# Patient Record
Sex: Male | Born: 1990
Health system: Southern US, Community
[De-identification: ages and names within clinical notes are randomized; demographics above are authoritative.]

## PROBLEM LIST (undated history)

## (undated) HISTORY — PX: HERNIA REPAIR: SHX51

---
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
## 2009-03-27 ENCOUNTER — Emergency Department (HOSPITAL_COMMUNITY): Admission: EM | Admit: 2009-03-27 | Discharge: 2009-03-27 | Payer: Self-pay | Admitting: Emergency Medicine

## 2009-10-18 ENCOUNTER — Emergency Department (HOSPITAL_COMMUNITY): Admission: EM | Admit: 2009-10-18 | Discharge: 2009-10-18 | Payer: Self-pay | Admitting: Emergency Medicine

## 2013-06-10 ENCOUNTER — Emergency Department (HOSPITAL_BASED_OUTPATIENT_CLINIC_OR_DEPARTMENT_OTHER): Payer: Worker's Compensation

## 2013-06-10 ENCOUNTER — Emergency Department (HOSPITAL_BASED_OUTPATIENT_CLINIC_OR_DEPARTMENT_OTHER)
Admission: EM | Admit: 2013-06-10 | Discharge: 2013-06-10 | Disposition: A | Discharge status: Home / Self Care | Payer: Worker's Compensation | Attending: Emergency Medicine | Admitting: Emergency Medicine

## 2013-06-10 ENCOUNTER — Encounter (HOSPITAL_BASED_OUTPATIENT_CLINIC_OR_DEPARTMENT_OTHER): Payer: Self-pay | Admitting: Emergency Medicine

## 2013-06-10 DIAGNOSIS — K439 Ventral hernia without obstruction or gangrene: Secondary | ICD-10-CM | POA: Insufficient documentation
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
## 2013-06-10 NOTE — ED Notes (Signed)
Pt states lifted something heavy at work on Friday and had a sharp pain, went to [facility] through work and dx with a hernia and referred to a Development worker, international aidgeneral surgeon. Pt states they are suppose to call him to make an appt. States pt was at work tonight and pain started again. Denies n/v

## 2013-06-10 NOTE — Discharge Instructions (Signed)
Hernia A hernia occurs when an internal organ pushes out through a weak spot in the abdominal wall. Hernias most commonly occur in the groin and around the navel. Hernias often can be pushed back into place (reduced). Most hernias tend to get worse over time. Some abdominal hernias can get stuck in the opening (irreducible or incarcerated hernia) and cannot be reduced. An irreducible abdominal hernia which is tightly squeezed into the opening is at risk for impaired blood supply (strangulated hernia). A strangulated hernia is a medical emergency. Because of the risk for an irreducible or strangulated hernia, surgery may be recommended to repair a hernia. CAUSES   Heavy lifting.  Prolonged coughing.  Straining to have a bowel movement.  A cut (incision) made during an abdominal surgery. HOME CARE INSTRUCTIONS   Bed rest is not required. You may continue your normal activities.  Avoid lifting more than 10 pounds (4.5 kg) or straining.  Cough gently. If you are a smoker it is best to stop. Even the best hernia repair can break down with the continual strain of coughing. Even if you do not have your hernia repaired, a cough will continue to aggravate the problem.  Do not wear anything tight over your hernia. Do not try to keep it in with an outside bandage or truss. These can damage abdominal contents if they are trapped within the hernia sac.  Eat a normal diet.  Avoid constipation. Straining over long periods of time will increase hernia size and encourage breakdown of repairs. If you cannot do this with diet alone, stool softeners may be used. SEEK IMMEDIATE MEDICAL CARE IF:   You have a fever.  You develop increasing abdominal pain.  You feel nauseous or vomit.  Your hernia is stuck outside the abdomen, looks discolored, feels hard, or is tender.  You have any changes in your bowel habits or in the hernia that are unusual for you.  You have increased pain or swelling around the  hernia.  You cannot push the hernia back in place by applying gentle pressure while lying down. MAKE SURE YOU:   Understand these instructions.  Will watch your condition.  Will get help right away if you are not doing well or get worse. Document Released: 05/08/2005 Document Revised: 07/31/2011 Document Reviewed: 12/26/2007 [employer] Patient Information 2014 [employer].  

## 2013-08-27 ENCOUNTER — Ambulatory Visit (HOSPITAL_COMMUNITY): Payer: Managed Care, Other (non HMO) | Attending: Family Medicine | Admitting: Physical Therapy

## 2014-03-06 ENCOUNTER — Encounter (HOSPITAL_COMMUNITY): Payer: Self-pay | Admitting: Emergency Medicine

## 2014-03-06 ENCOUNTER — Emergency Department (HOSPITAL_COMMUNITY)
Admission: EM | Admit: 2014-03-06 | Discharge: 2014-03-06 | Disposition: A | Discharge status: Home / Self Care | Payer: Managed Care, Other (non HMO) | Attending: Emergency Medicine | Admitting: Emergency Medicine

## 2014-03-06 ENCOUNTER — Emergency Department (HOSPITAL_COMMUNITY): Payer: Managed Care, Other (non HMO)

## 2014-03-06 DIAGNOSIS — R109 Unspecified abdominal pain: Secondary | ICD-10-CM | POA: Insufficient documentation

## 2014-03-06 DIAGNOSIS — K529 Noninfective gastroenteritis and colitis, unspecified: Secondary | ICD-10-CM | POA: Insufficient documentation

## 2014-03-06 DIAGNOSIS — M791 Myalgia: Secondary | ICD-10-CM | POA: Diagnosis not present

## 2014-03-06 DIAGNOSIS — Z79899 Other long term (current) drug therapy: Secondary | ICD-10-CM | POA: Insufficient documentation

## 2014-03-06 DIAGNOSIS — R5383 Other fatigue: Secondary | ICD-10-CM | POA: Insufficient documentation

## 2014-03-06 DIAGNOSIS — R112 Nausea with vomiting, unspecified: Secondary | ICD-10-CM | POA: Diagnosis present

## 2014-03-06 DIAGNOSIS — R0981 Nasal congestion: Secondary | ICD-10-CM | POA: Insufficient documentation

## 2014-03-06 LAB — BASIC METABOLIC PANEL
Anion gap: 16 — ABNORMAL HIGH (ref 5–15)
BUN: 18 mg/dL (ref 6–23)
CO2: 24 mEq/L (ref 19–32)
Calcium: 10.3 mg/dL (ref 8.4–10.5)
Chloride: 100 mEq/L (ref 96–112)
Creatinine, Ser: 1.31 mg/dL (ref 0.50–1.35)
GFR calc Af Amer: 86 mL/min — ABNORMAL LOW (ref >90)
GFR calc non Af Amer: 75 mL/min — ABNORMAL LOW (ref >90)
Glucose, Bld: 125 mg/dL — ABNORMAL HIGH (ref 70–99)
Potassium: 4.6 mEq/L (ref 3.7–5.3)
Sodium: 140 mEq/L (ref 137–147)

## 2014-03-06 LAB — CBC WITH DIFFERENTIAL/PLATELET
Basophils Absolute: 0 10*3/uL (ref 0.0–0.1)
Basophils Relative: 0 % (ref 0–1)
Eosinophils Absolute: 0 10*3/uL (ref 0.0–0.7)
Eosinophils Relative: 0 % (ref 0–5)
HCT: 49.6 % (ref 39.0–52.0)
Hemoglobin: 16.6 g/dL (ref 13.0–17.0)
Lymphocytes Relative: 5 % — ABNORMAL LOW (ref 12–46)
Lymphs Abs: 0.7 10*3/uL (ref 0.7–4.0)
MCH: 26.3 pg (ref 26.0–34.0)
MCHC: 33.5 g/dL (ref 30.0–36.0)
MCV: 78.6 fL (ref 78.0–100.0)
Monocytes Absolute: 0.7 10*3/uL (ref 0.1–1.0)
Monocytes Relative: 6 % (ref 3–12)
NEUTROS PCT: 89 % — AB (ref 43–77)
Neutro Abs: 11 10*3/uL — ABNORMAL HIGH (ref 1.7–7.7)
Platelets: 195 10*3/uL (ref 150–400)
RBC: 6.31 MIL/uL — ABNORMAL HIGH (ref 4.22–5.81)
RDW: 13.4 % (ref 11.5–15.5)
WBC: 12.4 10*3/uL — AB (ref 4.0–10.5)

## 2014-03-06 MED ORDER — ONDANSETRON HCL 4 MG/2ML IJ SOLN
4.0000 mg | Freq: Once | INTRAMUSCULAR | Status: AC
  Administered 2014-03-06: 4 mg via INTRAVENOUS
  Filled 2014-03-06: qty 2

## 2014-03-06 MED ORDER — ONDANSETRON 4 MG PO TBDP
4.0000 mg | ORAL_TABLET | Freq: Three times a day (TID) | ORAL | Status: DC | PRN
Start: 2014-03-06 — End: 2021-12-13

## 2014-03-06 MED ORDER — PROMETHAZINE HCL 25 MG PO TABS: 25.0000 mg | ORAL_TABLET | Freq: Four times a day (QID) | ORAL | Status: DC | PRN

## 2014-03-06 MED ORDER — LOPERAMIDE HCL 2 MG PO TABS: 2.0000 mg | ORAL_TABLET | Freq: Four times a day (QID) | ORAL | Status: DC | PRN

## 2014-03-06 MED ORDER — SODIUM CHLORIDE 0.9 % IV SOLN
INTRAVENOUS | Status: DC
  Administered 2014-03-06: 07:00:00 via INTRAVENOUS

## 2014-03-06 MED ORDER — HYDROMORPHONE HCL 1 MG/ML IJ SOLN
0.5000 mg | Freq: Once | INTRAMUSCULAR | Status: AC
  Administered 2014-03-06: 0.5 mg via INTRAVENOUS
  Filled 2014-03-06: qty 1

## 2014-03-06 NOTE — ED Notes (Signed)
Patient c/o vomiting and diarrhea since last evening.

## 2014-03-06 NOTE — Discharge Instructions (Signed)
Symptoms most likely consistent with a viral stomach bug. Labs without significant amounts. X-rays show show no evidence of any bowel obstruction. Take Phenergan and/or Zofran as needed for nausea and vomiting. The Zofran can be expensive the Phenergan is not. Imodium right ear as for the diarrhea. Small amounts of fluids frequently when keeping fluids down then advance to bland diet. Work note provided to be off for the next 2 days. Return for any new or worse symptoms.

## 2014-03-06 NOTE — ED Provider Notes (Signed)
CSN: [id number]     Arrival date & time 03/06/14  0444 History   First MD Initiated Contact with Patient 03/06/14 [phone]     Chief Complaint  Patient presents with  . Emesis     (Consider location/radiation/quality/duration/timing/severity/associated sxs/prior Treatment) Patient is a 23 y.o. male presenting with vomiting. The history is provided by the patient.  Emesis Associated symptoms: abdominal pain, diarrhea and myalgias   Associated symptoms: no headaches    patient feeling well yesterday she was onset at 11:00 PM of vomiting and diarrhea 5 episodes today. No blood in either one. Associated with crampy abdominal pain is generalized. Patient states pain is about 3/10. Associated with bodyaches no fever. No sick contacts. Patient was at work when he gets sick. Patient's past medical history is noncontributory. Patient does have a history of hernia repair.  History reviewed. No pertinent past medical history. Past Surgical History  Procedure Laterality Date  . Hernia repair     No family history on file. History  Substance Use Topics  . Smoking status: Never Smoker   . Smokeless tobacco: Not on file  . Alcohol Use: No    Review of Systems  Constitutional: Positive for fatigue. Negative for fever.  HENT: Positive for congestion.   Eyes: Negative for visual disturbance.  Respiratory: Negative for shortness of breath.   Cardiovascular: Negative for chest pain.  Gastrointestinal: Positive for nausea, vomiting, abdominal pain and diarrhea.  Genitourinary: Negative for hematuria.  Musculoskeletal: Positive for myalgias.  Skin: Negative for rash.  Neurological: Negative for headaches.  Hematological: Does not bruise/bleed easily.  Psychiatric/Behavioral: Negative for confusion.      Allergies  Review of patient's allergies indicates no known allergies.  Home Medications   Prior to Admission medications   Medication Sig Start Date End Date Taking? Authorizing Provider   loperamide (IMODIUM A-D) 2 MG tablet Take 1 tablet (2 mg total) by mouth 4 (four) times daily as needed for diarrhea or loose stools. 03/06/14   Vanetta MuldersScott Sophie Tamez, MD  ondansetron (ZOFRAN ODT) 4 MG disintegrating tablet Take 1 tablet (4 mg total) by mouth every 8 (eight) hours as needed. 03/06/14   Vanetta MuldersScott Jaymz Traywick, MD  promethazine (PHENERGAN) 25 MG tablet Take 1 tablet (25 mg total) by mouth every 6 (six) hours as needed. 03/06/14   Vanetta MuldersScott Allison Deshotels, MD   BP 132/86  Pulse 80  Temp(Src) 98.2 F (36.8 C) (Oral)  Resp 16  Ht 6' (1.829 m)  Wt 280 lb (127.007 kg)  BMI 37.97 kg/m2  SpO2 96% Physical Exam  Nursing note and vitals reviewed. Constitutional: He is oriented to person, place, and time. He appears well-developed and well-nourished. No distress.  HENT:  Head: Normocephalic and atraumatic.  Mouth/Throat: Oropharynx is clear and moist.  Eyes: Conjunctivae and EOM are normal. Pupils are equal, round, and reactive to light.  Neck: Normal range of motion.  Cardiovascular: Normal rate, regular rhythm and normal heart sounds.   No murmur heard. Pulmonary/Chest: Effort normal and breath sounds normal. No respiratory distress.  Abdominal: Soft. Bowel sounds are normal. There is no tenderness.  Musculoskeletal: Normal range of motion.  Neurological: He is alert and oriented to person, place, and time. No cranial nerve deficit. He exhibits normal muscle tone. Coordination normal.  Skin: Skin is warm. No rash noted.    ED Course  Procedures (including critical care time) Labs Review Labs Reviewed  CBC WITH DIFFERENTIAL - Abnormal; Notable for the following:    WBC 12.4 (*)  RBC 6.31 (*)    Neutrophils Relative % 89 (*)    Neutro Abs 11.0 (*)    Lymphocytes Relative 5 (*)    All other components within normal limits  BASIC METABOLIC PANEL - Abnormal; Notable for the following:    Glucose, Bld 125 (*)    GFR calc non Af Amer 75 (*)    GFR calc Af Amer 86 (*)    Anion gap 16 (*)     All other components within normal limits   Results for orders placed during the hospital encounter of 03/06/14  CBC WITH DIFFERENTIAL      Result Value Ref Range   WBC 12.4 (*) 4.0 - 10.5 K/uL   RBC 6.31 (*) 4.22 - 5.81 MIL/uL   Hemoglobin 16.6  13.0 - 17.0 g/dL   HCT 16.1  09.6 - 04.5 %   MCV 78.6  78.0 - 100.0 fL   MCH 26.3  26.0 - 34.0 pg   MCHC 33.5  30.0 - 36.0 g/dL   RDW 40.9  81.1 - 91.4 %   Platelets 195  150 - 400 K/uL   Neutrophils Relative % 89 (*) 43 - 77 %   Neutro Abs 11.0 (*) 1.7 - 7.7 K/uL   Lymphocytes Relative 5 (*) 12 - 46 %   Lymphs Abs 0.7  0.7 - 4.0 K/uL   Monocytes Relative 6  3 - 12 %   Monocytes Absolute 0.7  0.1 - 1.0 K/uL   Eosinophils Relative 0  0 - 5 %   Eosinophils Absolute 0.0  0.0 - 0.7 K/uL   Basophils Relative 0  0 - 1 %   Basophils Absolute 0.0  0.0 - 0.1 K/uL  BASIC METABOLIC PANEL      Result Value Ref Range   Sodium 140  137 - 147 mEq/L   Potassium 4.6  3.7 - 5.3 mEq/L   Chloride 100  96 - 112 mEq/L   CO2 24  19 - 32 mEq/L   Glucose, Bld 125 (*) 70 - 99 mg/dL   BUN 18  6 - 23 mg/dL   Creatinine, Ser 7.82  0.50 - 1.35 mg/dL   Calcium 95.6  8.4 - 21.3 mg/dL   GFR calc non Af Amer 75 (*) >90 mL/min   GFR calc Af Amer 86 (*) >90 mL/min   Anion gap 16 (*) 5 - 15     Imaging Review Dg Abd Acute W/chest  03/06/2014   CLINICAL DATA:  Emesis since 03/05/2014.  EXAM: ACUTE ABDOMEN SERIES (ABDOMEN 2 VIEW & CHEST 1 VIEW)  COMPARISON:  None.  FINDINGS: Normal cardiac silhouette. Lungs are clear. No air beneath hemidiaphragm.  No dilated loops large or small bowel. There is a paucity gas within the abdomen No again megaly. No pathologic calcifications. Benign bony excrescence extending from the left acetabular region.  IMPRESSION: 1.  No acute cardiopulmonary process. 2. Paucity bowel gas is consistent history of emesis. No evidence of bowel obstruction.   Electronically Signed   By: Genevive Bi M.D.   On: 03/06/2014 07:32     EKG  Interpretation None      MDM   Final diagnoses:  Gastroenteritis    Labs clinical finding history and x-rays consistent with a viral gastroenteritis. No acute surgical process. Patient feels better with IV fluids antinausea medicine. A little bit of pain medicine. Patient we discharged home with Imodium and Zofran and Phenergan. Patient's wife will be with him. Work note provided the  office next few days.  Acute abdominal series without evidence of bowel obstruction.   Vanetta MuldersScott Kashana Breach, MD 03/06/14 [phone]

## 2015-09-25 LAB — BASIC METABOLIC PANEL: Glucose: 89 mg/dL

## 2016-08-25 ENCOUNTER — Emergency Department (HOSPITAL_COMMUNITY)
Admission: EM | Admit: 2016-08-25 | Discharge: 2016-08-25 | Disposition: A | Discharge status: Home / Self Care | Payer: No Typology Code available for payment source | Attending: Emergency Medicine | Admitting: Emergency Medicine

## 2016-08-25 ENCOUNTER — Emergency Department (HOSPITAL_COMMUNITY): Payer: No Typology Code available for payment source

## 2016-08-25 ENCOUNTER — Encounter (HOSPITAL_COMMUNITY): Payer: Self-pay | Admitting: *Deleted

## 2016-08-25 DIAGNOSIS — Y92410 Unspecified street and highway as the place of occurrence of the external cause: Secondary | ICD-10-CM | POA: Insufficient documentation

## 2016-08-25 DIAGNOSIS — S39012A Strain of muscle, fascia and tendon of lower back, initial encounter: Secondary | ICD-10-CM | POA: Diagnosis not present

## 2016-08-25 DIAGNOSIS — S3992XA Unspecified injury of lower back, initial encounter: Secondary | ICD-10-CM | POA: Diagnosis present

## 2016-08-25 DIAGNOSIS — Y939 Activity, unspecified: Secondary | ICD-10-CM | POA: Insufficient documentation

## 2016-08-25 DIAGNOSIS — T148XXA Other injury of unspecified body region, initial encounter: Secondary | ICD-10-CM

## 2016-08-25 DIAGNOSIS — Y999 Unspecified external cause status: Secondary | ICD-10-CM | POA: Diagnosis not present

## 2016-08-25 DIAGNOSIS — M545 Low back pain, unspecified: Secondary | ICD-10-CM

## 2016-08-25 MED ORDER — CYCLOBENZAPRINE HCL 5 MG PO TABS: 5.0000 mg | ORAL_TABLET | Freq: Three times a day (TID) | ORAL | 0 refills | Status: DC | PRN

## 2016-08-25 MED ORDER — ACETAMINOPHEN 325 MG PO TABS
650.0000 mg | ORAL_TABLET | Freq: Once | ORAL | Status: AC
  Administered 2016-08-25: 650 mg via ORAL
  Filled 2016-08-25: qty 2

## 2016-08-25 MED ORDER — IBUPROFEN 800 MG PO TABS: 800.0000 mg | ORAL_TABLET | Freq: Three times a day (TID) | ORAL | 0 refills | Status: DC
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
## 2016-08-25 NOTE — ED Notes (Signed)
Pt in radiology 

## 2016-09-01 ENCOUNTER — Emergency Department (HOSPITAL_COMMUNITY): Payer: No Typology Code available for payment source

## 2016-09-01 ENCOUNTER — Emergency Department (HOSPITAL_COMMUNITY)
Admission: EM | Admit: 2016-09-01 | Discharge: 2016-09-01 | Disposition: A | Discharge status: Home / Self Care | Payer: No Typology Code available for payment source | Attending: Emergency Medicine | Admitting: Emergency Medicine

## 2016-09-01 ENCOUNTER — Encounter (HOSPITAL_COMMUNITY): Payer: Self-pay

## 2016-09-01 DIAGNOSIS — Z79899 Other long term (current) drug therapy: Secondary | ICD-10-CM | POA: Diagnosis not present

## 2016-09-01 DIAGNOSIS — S199XXD Unspecified injury of neck, subsequent encounter: Secondary | ICD-10-CM | POA: Diagnosis present

## 2016-09-01 DIAGNOSIS — S161XXD Strain of muscle, fascia and tendon at neck level, subsequent encounter: Secondary | ICD-10-CM

## 2016-09-01 MED ORDER — METHOCARBAMOL 500 MG PO TABS: 500.0000 mg | ORAL_TABLET | Freq: Two times a day (BID) | ORAL | 0 refills | Status: DC

## 2016-09-01 MED ORDER — IBUPROFEN 800 MG PO TABS
800.0000 mg | ORAL_TABLET | Freq: Once | ORAL | Status: AC
  Administered 2016-09-01: 800 mg via ORAL
  Filled 2016-09-01: qty 1
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
## 2016-09-01 NOTE — Discharge Instructions (Signed)
Take ibuprofen every 8 hours whether you feel like you need it or not. Stop taking Flexeril. Take Robaxin instead as a muscle relaxer. Follow-up with your doctor. Return to the ED if you develop new or worsening symptoms.

## 2018-05-02 ENCOUNTER — Emergency Department (HOSPITAL_COMMUNITY): Payer: No Typology Code available for payment source

## 2018-05-02 ENCOUNTER — Other Ambulatory Visit: Payer: Self-pay

## 2018-05-02 ENCOUNTER — Encounter (HOSPITAL_COMMUNITY): Payer: Self-pay | Admitting: Emergency Medicine

## 2018-05-02 ENCOUNTER — Emergency Department (HOSPITAL_COMMUNITY)
Admission: EM | Admit: 2018-05-02 | Discharge: 2018-05-02 | Disposition: A | Discharge status: Home / Self Care | Payer: No Typology Code available for payment source | Attending: Emergency Medicine | Admitting: Emergency Medicine

## 2018-05-02 DIAGNOSIS — Z79899 Other long term (current) drug therapy: Secondary | ICD-10-CM | POA: Insufficient documentation

## 2018-05-02 DIAGNOSIS — T07XXXA Unspecified multiple injuries, initial encounter: Secondary | ICD-10-CM | POA: Diagnosis not present

## 2018-05-02 DIAGNOSIS — Y92410 Unspecified street and highway as the place of occurrence of the external cause: Secondary | ICD-10-CM | POA: Insufficient documentation

## 2018-05-02 DIAGNOSIS — Y999 Unspecified external cause status: Secondary | ICD-10-CM | POA: Diagnosis not present

## 2018-05-02 DIAGNOSIS — S199XXA Unspecified injury of neck, initial encounter: Secondary | ICD-10-CM | POA: Diagnosis not present

## 2018-05-02 DIAGNOSIS — Y9389 Activity, other specified: Secondary | ICD-10-CM | POA: Diagnosis not present
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
## 2018-05-02 NOTE — Discharge Instructions (Addendum)
Use ice on the sore spots 3 or 4 times a day, for 2 days, after that use heat.  For pain use ibuprofen 400 mg 3 times a day.  See the doctor of your choice as needed for problems.

## 2018-05-13 ENCOUNTER — Telehealth: Payer: Self-pay | Admitting: Orthopedic Surgery

## 2018-05-13 NOTE — Telephone Encounter (Signed)
Raymond Campos came by and stated he wanted appointment.  [facility] stated for him to see physician of his choice.  I explained to him that our doctors usually request that they see PCP and then get a referral here.  He stated he didn't have PCP.  I gave him a list of PCP in the area.  He will call one of them to see when he can get in.  He will do this today.

## 2018-06-11 DIAGNOSIS — M546 Pain in thoracic spine: Secondary | ICD-10-CM | POA: Diagnosis not present

## 2018-06-11 DIAGNOSIS — M2560 Stiffness of unspecified joint, not elsewhere classified: Secondary | ICD-10-CM | POA: Diagnosis not present

## 2018-06-11 DIAGNOSIS — M2550 Pain in unspecified joint: Secondary | ICD-10-CM | POA: Diagnosis not present

## 2018-06-11 DIAGNOSIS — M542 Cervicalgia: Secondary | ICD-10-CM | POA: Diagnosis not present

## 2018-07-02 DIAGNOSIS — E669 Obesity, unspecified: Secondary | ICD-10-CM | POA: Diagnosis not present

## 2018-07-02 DIAGNOSIS — J069 Acute upper respiratory infection, unspecified: Secondary | ICD-10-CM | POA: Diagnosis not present

## 2018-07-03 DIAGNOSIS — M546 Pain in thoracic spine: Secondary | ICD-10-CM | POA: Diagnosis not present

## 2018-07-03 DIAGNOSIS — M542 Cervicalgia: Secondary | ICD-10-CM | POA: Diagnosis not present

## 2018-07-03 DIAGNOSIS — M2560 Stiffness of unspecified joint, not elsewhere classified: Secondary | ICD-10-CM | POA: Diagnosis not present

## 2018-07-03 DIAGNOSIS — M2550 Pain in unspecified joint: Secondary | ICD-10-CM | POA: Diagnosis not present

## 2018-07-09 DIAGNOSIS — M546 Pain in thoracic spine: Secondary | ICD-10-CM | POA: Diagnosis not present

## 2018-07-09 DIAGNOSIS — M542 Cervicalgia: Secondary | ICD-10-CM | POA: Diagnosis not present

## 2018-07-09 DIAGNOSIS — M2550 Pain in unspecified joint: Secondary | ICD-10-CM | POA: Diagnosis not present

## 2018-07-09 DIAGNOSIS — M2560 Stiffness of unspecified joint, not elsewhere classified: Secondary | ICD-10-CM | POA: Diagnosis not present

## 2018-07-16 DIAGNOSIS — M2560 Stiffness of unspecified joint, not elsewhere classified: Secondary | ICD-10-CM | POA: Diagnosis not present

## 2018-07-16 DIAGNOSIS — M542 Cervicalgia: Secondary | ICD-10-CM | POA: Diagnosis not present

## 2018-07-16 DIAGNOSIS — M2550 Pain in unspecified joint: Secondary | ICD-10-CM | POA: Diagnosis not present

## 2018-07-16 DIAGNOSIS — M546 Pain in thoracic spine: Secondary | ICD-10-CM | POA: Diagnosis not present

## 2018-07-17 DIAGNOSIS — M542 Cervicalgia: Secondary | ICD-10-CM | POA: Diagnosis not present

## 2018-07-17 DIAGNOSIS — E669 Obesity, unspecified: Secondary | ICD-10-CM | POA: Diagnosis not present

## 2018-07-23 DIAGNOSIS — M2560 Stiffness of unspecified joint, not elsewhere classified: Secondary | ICD-10-CM | POA: Diagnosis not present

## 2018-07-23 DIAGNOSIS — M546 Pain in thoracic spine: Secondary | ICD-10-CM | POA: Diagnosis not present

## 2018-07-23 DIAGNOSIS — M542 Cervicalgia: Secondary | ICD-10-CM | POA: Diagnosis not present

## 2018-07-23 DIAGNOSIS — M2550 Pain in unspecified joint: Secondary | ICD-10-CM | POA: Diagnosis not present
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
## 2021-11-23 ENCOUNTER — Ambulatory Visit (INDEPENDENT_AMBULATORY_CARE_PROVIDER_SITE_OTHER): Payer: 59

## 2021-11-23 ENCOUNTER — Ambulatory Visit
Admission: RE | Admit: 2021-11-23 | Discharge: 2021-11-23 | Disposition: A | Discharge status: Home / Self Care | Payer: 59 | Source: Ambulatory Visit | Attending: Family Medicine | Admitting: Family Medicine

## 2021-11-23 VITALS — BP 136/88 | HR 79 | Temp 98.0°F | Resp 18

## 2021-11-23 DIAGNOSIS — M79672 Pain in left foot: Secondary | ICD-10-CM | POA: Diagnosis not present

## 2021-11-23 DIAGNOSIS — M25572 Pain in left ankle and joints of left foot: Secondary | ICD-10-CM

## 2021-11-23 MED ORDER — CYCLOBENZAPRINE HCL 10 MG PO TABS: 10.0000 mg | ORAL_TABLET | Freq: Every evening | ORAL | 0 refills | Status: AC | PRN

## 2021-11-23 NOTE — ED Provider Notes (Signed)
[facility]    CSN: [id number] Arrival date & time: 11/23/21  1743      History   Chief Complaint Chief Complaint  Patient presents with   Ankle Pain    Entered by patient    HPI Raymond Campos is a 31 y.o. male.   Presenting today with 1 month history of left Achilles, lateral foot pain after inverting the foot while playing basketball.  He states off-and-on the foot has been swollen, bruised and he still has pain with weightbearing or inverting the foot.  Has been taking ibuprofen, Tylenol and wearing an ankle brace with no relief.    History reviewed. No pertinent past medical history.  There are no problems to display for this patient.   Past Surgical History:  Procedure Laterality Date   HERNIA REPAIR         Home Medications    Prior to Admission medications   Medication Sig Start Date End Date Taking? Authorizing Provider  cyclobenzaprine (FLEXERIL) 10 MG tablet Take 1 tablet (10 mg total) by mouth at bedtime as needed for muscle spasms. Do not drink alcohol or drive while taking this medication.  May cause drowsiness. 11/23/21  Yes Particia Nearing, PA-C  ibuprofen (ADVIL,MOTRIN) 800 MG tablet Take 1 tablet (800 mg total) by mouth 3 (three) times daily. 08/25/16   Burgess Amor, PA-C  loperamide (IMODIUM A-D) 2 MG tablet Take 1 tablet (2 mg total) by mouth 4 (four) times daily as needed for diarrhea or loose stools. 03/06/14   Vanetta Mulders, MD  methocarbamol (ROBAXIN) 500 MG tablet Take 1 tablet (500 mg total) by mouth 2 (two) times daily. 09/01/16   Rancour, Jeannett Senior, MD  ondansetron (ZOFRAN ODT) 4 MG disintegrating tablet Take 1 tablet (4 mg total) by mouth every 8 (eight) hours as needed. 03/06/14   Vanetta Mulders, MD  promethazine (PHENERGAN) 25 MG tablet Take 1 tablet (25 mg total) by mouth every 6 (six) hours as needed. 03/06/14   Vanetta Mulders, MD    Family History History reviewed. No pertinent family history.  Social  History Social History   Tobacco Use   Smoking status: Never   Smokeless tobacco: Never  Substance Use Topics   Alcohol use: No   Drug use: No     Allergies   Patient has no known allergies.   Review of Systems Review of Systems PER HPI  Physical Exam Triage Vital Signs ED Triage Vitals  Enc Vitals Group     BP 11/23/21 1749 136/88     Pulse Rate 11/23/21 1749 79     Resp 11/23/21 1749 18     Temp 11/23/21 1749 98 F (36.7 C)     Temp Source 11/23/21 1749 Oral     SpO2 11/23/21 1749 96 %     Weight --      Height --      Head Circumference --      Peak Flow --      Pain Score 11/23/21 1750 10     Pain Loc --      Pain Edu? --      Excl. in GC? --    No data found.  Updated Vital Signs BP 136/88 (BP Location: Right Arm)   Pulse 79   Temp 98 F (36.7 C) (Oral)   Resp 18   SpO2 96%   Visual Acuity Right Eye Distance:   Left Eye Distance:   Bilateral Distance:    Right Eye  Near:   Left Eye Near:    Bilateral Near:     Physical Exam Vitals and nursing note reviewed.  Constitutional:      Appearance: Normal appearance.  HENT:     Head: Atraumatic.  Eyes:     Extraocular Movements: Extraocular movements intact.     Conjunctiva/sclera: Conjunctivae normal.  Cardiovascular:     Rate and Rhythm: Normal rate and regular rhythm.  Pulmonary:     Effort: Pulmonary effort is normal.     Breath sounds: Normal breath sounds.  Musculoskeletal:        General: Swelling, tenderness and signs of injury present. No deformity. Normal range of motion.     Cervical back: Normal range of motion and neck supple.     Comments: Tenderness to palpation left Achilles, lateral dorsal left foot with mild edema to the dorsal foot.  Range of motion intact but painful with inversion  Skin:    General: Skin is warm and dry.     Findings: No bruising or erythema.  Neurological:     General: No focal deficit present.     Mental Status: He is oriented to person, place, and  time.     Comments: Left foot neurovascularly intact  Psychiatric:        Mood and Affect: Mood normal.        Thought Content: Thought content normal.        Judgment: Judgment normal.    UC Treatments / Results  Labs (all labs ordered are listed, but only abnormal results are displayed) Labs Reviewed - No data to display  EKG   Radiology DG Foot Complete Left  Result Date: 11/23/2021 CLINICAL DATA:  Left foot and ankle pain, inversion injury 1 month ago EXAM: LEFT FOOT - COMPLETE 3+ VIEW COMPARISON:  None Available. FINDINGS: Frontal, oblique, and lateral views of the left foot are obtained. No acute displaced fracture, subluxation, or dislocation. Joint spaces are well preserved. Soft tissues are unremarkable. IMPRESSION: 1. Unremarkable left foot. Electronically Signed   By: Sharlet Salina M.D.   On: 11/23/2021 18:37   DG Ankle Complete Left  Result Date: 11/23/2021 CLINICAL DATA:  Injury.  Ankle pain EXAM: LEFT ANKLE COMPLETE - 3+ VIEW COMPARISON:  08/27/2005 FINDINGS: Normal alignment.  No acute fracture.  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 Chronic healed fracture distal fibula. IMPRESSION: Negative for fracture Electronically Signed   By: Marlan Palau M.D.   On: 11/23/2021 18:07    Procedures Procedures (including critical care time)  Medications Ordered in UC Medications - No data to display  Initial Impression / Assessment and Plan / UC Course  I have reviewed the triage vital signs and the nursing notes.  Pertinent labs & imaging results that were available during my care of the patient were reviewed by me and considered in my medical decision making (see chart for details).     Left foot and left ankle x-rays negative for acute bony abnormality, discussed RICE protocol, over-the-counter pain relievers, Flexeril at bedtime and Ortho follow-up if still not improving.  Final  Clinical Impressions(s) / UC Diagnoses   Final diagnoses:  Left foot pain  Acute left ankle pain   Discharge Instructions   None    ED Prescriptions     Medication Sig Dispense Auth. Provider   cyclobenzaprine (FLEXERIL) 10 MG tablet Take 1 tablet (10 mg total) by mouth at bedtime as needed  for muscle spasms. Do not drink alcohol or drive while taking this medication.  May cause drowsiness. 10 tablet Particia Nearing, [place]      PDMP not reviewed this encounter.   Particia Nearing, [place] 11/23/21 [phone]

## 2021-11-23 NOTE — ED Triage Notes (Signed)
While playing basketball, he twisted left ankle a month ago.

## 2021-12-12 ENCOUNTER — Ambulatory Visit: Payer: 59 | Admitting: Orthopedic Surgery

## 2021-12-13 ENCOUNTER — Encounter: Payer: Self-pay | Admitting: Orthopedic Surgery

## 2021-12-13 ENCOUNTER — Ambulatory Visit (INDEPENDENT_AMBULATORY_CARE_PROVIDER_SITE_OTHER): Payer: 59 | Admitting: Orthopedic Surgery

## 2021-12-13 VITALS — Ht 72.0 in | Wt 300.0 lb

## 2021-12-13 DIAGNOSIS — S93492A Sprain of other ligament of left ankle, initial encounter: Secondary | ICD-10-CM | POA: Diagnosis not present

## 2021-12-13 NOTE — Patient Instructions (Signed)

## 2021-12-14 NOTE — Progress Notes (Signed)
New Patient Visit  Assessment: Raymond Campos is a 31 y.o. male with the following: 1. Sprain of anterior talofibular ligament of left ankle, initial encounter  Plan: Raymond Campos sustained a left ankle fracture, with possible nondisplaced fracture of the distal fibula.  Recent radiographs are without obvious injury.  He is wearing a regular shoe, but would likely benefit from focused home exercises.  A referral was placed today.  Medications as needed.  Follow-up as needed.  Follow-up: Return if symptoms worsen or fail to improve.  Subjective:  Chief Complaint  Patient presents with   Ankle Pain    Lt ankle pain after twisting ankle 2 mos ago while playing basketball. Pt states he has tried to play ball once since then and had a lot of pain and swelling.     History of Present Illness: Raymond Campos is a 31 y.o. male who presents for evaluation of left ankle pain.  He was fine basketball approximately 2 months ago, when he twisted his ankle.  He had immediate pain and swelling.  Since then, he has tried to play vascular once.  However, the pain and swelling was too much.  He has tried wearing a brace.  Due to persistent pain and swelling, he presented for evaluation.   Review of Systems: No fevers or chills No numbness or tingling No chest pain No shortness of breath No bowel or bladder dysfunction No GI distress No headaches   Medical History:  History reviewed. No pertinent past medical history.  Past Surgical History:  Procedure Laterality Date   HERNIA REPAIR      History reviewed. No pertinent family history. Social History   Tobacco Use   Smoking status: Never   Smokeless tobacco: Never  Substance Use Topics   Alcohol use: No   Drug use: No    No Known Allergies  Current Meds  Medication Sig   cyclobenzaprine (FLEXERIL) 10 MG tablet Take 1 tablet (10 mg total) by mouth at bedtime as needed for muscle spasms. Do not drink alcohol or drive  while taking this medication.  May cause drowsiness.    Objective: Ht 6' (1.829 m)   Wt 300 lb (136.1 kg)   BMI 40.69 kg/m   Physical Exam:  General: Alert and oriented. and No acute distress. Gait: Left sided antalgic gait.  Left ankle with minimal swelling.  Mild tenderness over the anterior and lateral ankle.  He tolerates resisted motion in all planes.  Toes warm and well-perfused.  No obvious instability.  Sensation is intact over the dorsum of the foot.  IMAGING: I personally reviewed images previously obtained in clinic  X-rays left ankle, nonweightbearing, are without acute injury.  Good overall alignment.   New Medications:  No orders of the defined types were placed in this encounter.     Oliver Barre, MD  12/14/2021 10:09 AM

## 2023-06-05 ENCOUNTER — Other Ambulatory Visit: Payer: Self-pay

## 2023-06-05 ENCOUNTER — Emergency Department (HOSPITAL_COMMUNITY)
Admission: EM | Admit: 2023-06-05 | Discharge: 2023-06-06 | Disposition: A | Discharge status: Home / Self Care | Payer: Self-pay | Attending: Emergency Medicine | Admitting: Emergency Medicine

## 2023-06-05 ENCOUNTER — Emergency Department (HOSPITAL_COMMUNITY): Payer: Self-pay

## 2023-06-05 ENCOUNTER — Encounter (HOSPITAL_COMMUNITY): Payer: Self-pay

## 2023-06-05 DIAGNOSIS — E860 Dehydration: Secondary | ICD-10-CM | POA: Insufficient documentation

## 2023-06-05 DIAGNOSIS — R112 Nausea with vomiting, unspecified: Secondary | ICD-10-CM | POA: Insufficient documentation

## 2023-06-05 DIAGNOSIS — R1084 Generalized abdominal pain: Secondary | ICD-10-CM | POA: Insufficient documentation

## 2023-06-05 DIAGNOSIS — R55 Syncope and collapse: Secondary | ICD-10-CM | POA: Insufficient documentation

## 2023-06-05 DIAGNOSIS — R6883 Chills (without fever): Secondary | ICD-10-CM | POA: Insufficient documentation

## 2023-06-05 LAB — BASIC METABOLIC PANEL
Anion gap: 9 (ref 5–15)
BUN: 17 mg/dL (ref 6–20)
CO2: 25 mmol/L (ref 22–32)
Calcium: 9.4 mg/dL (ref 8.9–10.3)
Chloride: 103 mmol/L (ref 98–111)
Creatinine, Ser: 1.14 mg/dL (ref 0.61–1.24)
GFR, Estimated: >60 mL/min (ref >60)
Glucose, Bld: 92 mg/dL (ref 70–99)
Potassium: 4.1 mmol/L (ref 3.5–5.1)
Sodium: 137 mmol/L (ref 135–145)

## 2023-06-05 LAB — CBC
HCT: 45.8 % (ref 39.0–52.0)
Hemoglobin: 14.6 g/dL (ref 13.0–17.0)
MCH: 26.2 pg (ref 26.0–34.0)
MCHC: 31.9 g/dL (ref 30.0–36.0)
MCV: 82.1 fL (ref 80.0–100.0)
Platelets: 188 10*3/uL (ref 150–400)
RBC: 5.58 MIL/uL (ref 4.22–5.81)
RDW: 13.6 % (ref 11.5–15.5)
WBC: 8.5 10*3/uL (ref 4.0–10.5)
nRBC: 0 % (ref 0.0–0.2)

## 2023-06-05 LAB — CBG MONITORING, ED: Glucose-Capillary: 104 mg/dL — ABNORMAL HIGH (ref 70–99)

## 2023-06-05 MED ORDER — SODIUM CHLORIDE 0.9 % IV SOLN
25.0000 mg | Freq: Four times a day (QID) | INTRAVENOUS | Status: DC | PRN
  Administered 2023-06-06: 25 mg via INTRAVENOUS
  Filled 2023-06-05: qty 1

## 2023-06-05 MED ORDER — PANTOPRAZOLE SODIUM 40 MG IV SOLR
40.0000 mg | Freq: Once | INTRAVENOUS | Status: AC
  Administered 2023-06-05: 40 mg via INTRAVENOUS
  Filled 2023-06-05: qty 10

## 2023-06-05 MED ORDER — IOHEXOL 300 MG/ML  SOLN
100.0000 mL | Freq: Once | INTRAMUSCULAR | Status: AC | PRN
  Administered 2023-06-05: 100 mL via INTRAVENOUS

## 2023-06-05 MED ORDER — LACTATED RINGERS IV BOLUS
1000.0000 mL | Freq: Once | INTRAVENOUS | Status: AC
  Administered 2023-06-05: 1000 mL via INTRAVENOUS

## 2023-06-05 NOTE — ED Provider Notes (Signed)
 Emergency Department Provider Note  TRIAGE NOTE: Pt BIB ems for Noro virus. Per ems pt was seen at [facility] today and dx. Pt has been vomiting all day with no fever, one episode of diarrhea. Pt states having cramps in his stomach. EMS was called out for pt passing out in the bathroom, per wife pt was in there vomiting. EMS gave 4 zofran  IV.    HISTORY  Chief Complaint Loss of Consciousness   HPI Raymond Campos is a 33 y.o. male with no significant past medical history presents the ER today secondary to nausea syncope.  Patient states he started feeling nauseous last night and had a couple episodes of emesis at work overnight.  Felt a little bit better for couple hours but then after he got home he had multiple episodes of nonbloody nonbilious emesis.  1 small bowel movement that was normal to speak about but no passing gas since that time.  Syncopized a couple times in the bathroom related to throwing up and dehydration.  Had some Zofran  on the way here which did not seem to help much as he still feels overall bad.  He has some diffuse abdominal pain and cramping.  States that he went to urgent care earlier today and they still made norovirus.  He states that is going around his facility.  No suspicious food intake.  No other known sick contacts.  No recent travels. Does have a history of hernia repair. Also endorses chills, no known fevers.   PMH History reviewed. No pertinent past medical history.  Home Medications Prior to Admission medications   Medication Sig Start Date End Date Taking? Authorizing Provider  ondansetron  (ZOFRAN -ODT) 4 MG disintegrating tablet Take 1 tablet (4 mg total) by mouth every 8 (eight) hours as needed for vomiting. 06/06/23  Yes Salia Cangemi, Selinda, MD  promethazine  (PHENERGAN ) 25 MG suppository Place 1 suppository (25 mg total) rectally every 6 (six) hours as needed for nausea or vomiting. 06/06/23  Yes Konya Fauble, Selinda, MD  promethazine  (PHENERGAN ) 25 MG tablet  Take 1 tablet (25 mg total) by mouth every 6 (six) hours as needed for nausea or vomiting. 06/06/23  Yes Heber Hoog, Selinda, MD  cyclobenzaprine  (FLEXERIL ) 10 MG tablet Take 1 tablet (10 mg total) by mouth at bedtime as needed for muscle spasms. Do not drink alcohol or drive while taking this medication.  May cause drowsiness. 11/23/21   Stuart Vernell Norris, PA-C    Social History Social History   Tobacco Use   Smoking status: Never   Smokeless tobacco: Never  Substance Use Topics   Alcohol use: Yes    Comment: occasional   Drug use: No    Review of Systems: Documented in HPI ____________________________________________  PHYSICAL EXAM: VITAL SIGNS: ED Triage Vitals  Encounter Vitals Group     BP 06/05/23 2139 (!) 141/101     Systolic BP Percentile --      Diastolic BP Percentile --      Pulse Rate 06/05/23 2139 98     Resp 06/05/23 2139 18     Temp 06/05/23 2139 98.5 F (36.9 C)     Temp Source 06/05/23 2139 Oral     SpO2 06/05/23 2139 95 %     Weight 06/05/23 2141 (!) 303 lb (137.4 kg)     Height 06/05/23 2141 6' (1.829 m)   Physical Exam Vitals and nursing note reviewed.  Constitutional:      Appearance: He is well-developed.  HENT:  Head: Normocephalic and atraumatic.     Nose: No rhinorrhea.     Mouth/Throat:     Mouth: Mucous membranes are dry.  Cardiovascular:     Rate and Rhythm: Normal rate.  Pulmonary:     Effort: Pulmonary effort is normal. No respiratory distress.  Abdominal:     General: There is no distension.     Tenderness: There is abdominal tenderness (diffuse).     Comments: Diminished bowel sounds  Scar in mid upper abdomen area  Musculoskeletal:        General: Normal range of motion.     Cervical back: Normal range of motion.  Skin:    General: Skin is warm and dry.  Neurological:     General: No focal deficit present.     Mental Status: He is alert.       ____________________________________________   LABS (all labs ordered are  listed, but only abnormal results are displayed)  Labs Reviewed  URINALYSIS, ROUTINE W REFLEX MICROSCOPIC - Abnormal; Notable for the following components:      Result Value   Specific Gravity, Urine 1.045 (*)    Ketones, ur 5 (*)    Protein, ur 30 (*)    Bacteria, UA RARE (*)    All other components within normal limits  CBG MONITORING, ED - Abnormal; Notable for the following components:   Glucose-Capillary 104 (*)    All other components within normal limits  BASIC METABOLIC PANEL  CBC   ____________________________________________  EKG   EKG Interpretation Date/Time:  Tuesday June 05 2023 21:51:33 EST Ventricular Rate:  88 PR Interval:  145 QRS Duration:  96 QT Interval:  366 QTC Calculation: 443 R Axis:   266  Text Interpretation: Sinus rhythm LAD, consider left anterior fascicular block Confirmed by Lorette Mayo [phone]) on 06/05/2023 11:15:48 PM      ____________________________________________  RADIOLOGY  CT ABDOMEN PELVIS W CONTRAST Result Date: 06/05/2023 CLINICAL DATA:  Abdominal pain with vomiting and diarrhea. EXAM: CT ABDOMEN AND PELVIS WITH CONTRAST TECHNIQUE: Multidetector CT imaging of the abdomen and pelvis was performed using the standard protocol following bolus administration of intravenous contrast. RADIATION DOSE REDUCTION: This exam was performed according to the departmental dose-optimization program which includes automated exposure control, adjustment of the mA and/or kV according to patient size and/or use of iterative reconstruction technique. CONTRAST:  OMNIPAQUE  IOHEXOL  300 MG/ML  SOLN COMPARISON:  June 10, 2013 FINDINGS: Lower chest: No acute abnormality. Hepatobiliary: No focal liver abnormality is seen. No gallstones, gallbladder wall thickening, or biliary dilatation. Pancreas: Unremarkable. No pancreatic ductal dilatation or surrounding inflammatory changes. Spleen: Normal in size without focal abnormality. Adrenals/Urinary Tract:  Adrenal glands are unremarkable. Kidneys are normal in size, without renal calculi or hydronephrosis. A 9 mm diameter cyst is seen within the lateral aspect of the mid right kidney. Bladder is unremarkable. Stomach/Bowel: Stomach is within normal limits. Appendix appears normal. No evidence of bowel wall thickening, distention, or inflammatory changes. Vascular/Lymphatic: No significant vascular findings are present. No enlarged abdominal or pelvic lymph nodes. Reproductive: Prostate is unremarkable. Other: No abdominal wall hernia or abnormality. No abdominopelvic ascites. Musculoskeletal: No acute or significant osseous findings. IMPRESSION: No acute or active process within the abdomen or pelvis. Electronically Signed   By: Suzen Dials M.D.   On: 06/05/2023 23:50   ____________________________________________  PROCEDURES  Procedure(s) performed:   Procedures ____________________________________________  INITIAL IMPRESSION / ASSESSMENT AND PLAN   This patient presents to the ED for concern of  emesis, this involves an extensive number of treatment options, and is a complaint that carries with it a high risk of complications and morbidity.  Based on exam, history of present illness I think the most likely etiology is viral, however am considering SBO, colitis as well but thought to be less likely based on H&P and workup to this point.   Additional history obtained:  Additional history obtained from wife at bedside Previous records obtained and reviewed in epic  Co morbidities that complicate the patient evaluation  Hernia surgery obesity  Social Determinants of Health:  Works in LT care facility  Initial Plan:  Rule sbo with ct scan. Labs look ok, rehydration and symptom control.   Screening labs including CBC and Metabolic panel to evaluate for infectious or metabolic etiology of disease.  CXR to evaluate for structural/infectious intrathoracic pathology.  EKG to evaluate for  cardiac pathology. Objective evaluation as below reviewed with plan for close reassessment  ED Course  Images ordered viewed and obtained by myself. Agree with Radiology interpretation. Details in ED course.  Labs ordered reviewed by myself as detailed in ED course.  Consultations obtained/considered detailed in ED course.   Clinical Course as of 06/06/23 0635  Tue Jun 05, 2023  2359 BP(!): 141/101 reassuring [JM]  2359 ECG Heart Rate: 90 [JM]  2359 Glucose-Capillary(!): 104 [JM]  2359 WBC: 8.5 [JM]  2359 Potassium: 4.1 [JM]  Wed Jun 06, 2023  0000 Maybe some enteritis on my interpretation of ct, no free fluid, rads read reviewed. [JM]    Clinical Course User Index [JM] Retaj Hilbun, Selinda, MD      Cardiac Monitoring:  The patient was maintained on a cardiac monitor.  I personally viewed and interpreted the cardiac monitored which showed an underlying rhythm of: NSR  CRITICAL INTERVENTIONS:  Antiemetics, fluids observation  Reevaluation:  After the interventions noted above, I reevaluated the patient and found that they have :improved  Patient tolerating p.o.  Able to ambulate without emesis.  Sleepy from medications but otherwise feels improved.  Discussed observation in the hospital however at this time being asymptomatic and improving we will go home.  Will have a slow return to diet just clear liquids for today.  Will return here for any worsening symptoms.  Follow-up with PCP in 2 to 3 days if not improving.  FINAL IMPRESSION AND PLAN Final diagnoses:  Dehydration  Nausea and vomiting, unspecified vomiting type    medical screening exam was performed and I feel the patient has had an appropriate workup for their chief complaint at this time and likelihood of emergent condition existing is low. They have been counseled on decision, DISCHARGE, follow up and which symptoms necessitate immediate return to the emergency department. They or their family verbally stated  understanding and agreement with plan and discharged in stable condition.   ____________________________________________   NEW OUTPATIENT MEDICATIONS STARTED DURING THIS VISIT:  New Prescriptions   ONDANSETRON  (ZOFRAN -ODT) 4 MG DISINTEGRATING TABLET    Take 1 tablet (4 mg total) by mouth every 8 (eight) hours as needed for vomiting.   PROMETHAZINE  (PHENERGAN ) 25 MG SUPPOSITORY    Place 1 suppository (25 mg total) rectally every 6 (six) hours as needed for nausea or vomiting.   PROMETHAZINE  (PHENERGAN ) 25 MG TABLET    Take 1 tablet (25 mg total) by mouth every 6 (six) hours as needed for nausea or vomiting.    Note:  This note was prepared with assistance of Dragon voice recognition software. Occasional  wrong-word or sound-a-like substitutions may have occurred due to the inherent limitations of voice recognition software.    Lorette Mayo, MD 06/06/23 [phone]

## 2023-06-05 NOTE — ED Notes (Signed)
 Patient transported to CT

## 2023-06-05 NOTE — ED Triage Notes (Signed)
 Pt BIB ems for Noro virus. Per ems pt was seen at [facility] today and dx. Pt has been vomiting all day with no fever, one episode of diarrhea. Pt states having cramps in his stomach. EMS was called out for pt passing out in the bathroom, per wife pt was in there vomiting. EMS gave 4 zofran  IV.

## 2023-06-06 LAB — URINALYSIS, ROUTINE W REFLEX MICROSCOPIC
Bilirubin Urine: NEGATIVE
Glucose, UA: NEGATIVE mg/dL
Hgb urine dipstick: NEGATIVE
Ketones, ur: 5 mg/dL — AB
Leukocytes,Ua: NEGATIVE
Nitrite: NEGATIVE
Protein, ur: 30 mg/dL — AB
Specific Gravity, Urine: 1.045 — ABNORMAL HIGH (ref 1.005–1.030)
pH: 5 (ref 5.0–8.0)

## 2023-06-06 MED ORDER — PROMETHAZINE HCL 25 MG/ML IJ SOLN
INTRAMUSCULAR | Status: AC
  Filled 2023-06-06: qty 1

## 2023-06-06 MED ORDER — PROMETHAZINE HCL 25 MG RE SUPP: 25.0000 mg | Freq: Four times a day (QID) | RECTAL | 0 refills | Status: AC | PRN

## 2023-06-06 MED ORDER — LACTATED RINGERS IV BOLUS
1000.0000 mL | Freq: Once | INTRAVENOUS | Status: AC
  Administered 2023-06-06: 1000 mL via INTRAVENOUS

## 2023-06-06 MED ORDER — METOCLOPRAMIDE HCL 5 MG/ML IJ SOLN
10.0000 mg | Freq: Once | INTRAMUSCULAR | Status: AC
  Administered 2023-06-06: 10 mg via INTRAVENOUS
  Filled 2023-06-06: qty 2

## 2023-06-06 MED ORDER — PROMETHAZINE HCL 25 MG PO TABS: 25.0000 mg | ORAL_TABLET | Freq: Four times a day (QID) | ORAL | 0 refills | Status: AC | PRN

## 2023-06-06 MED ORDER — ONDANSETRON 4 MG PO TBDP: 4.0000 mg | ORAL_TABLET | Freq: Three times a day (TID) | ORAL | 0 refills | Status: AC | PRN

## 2023-06-06 NOTE — ED Notes (Signed)
 Patient was given a cup of water. Patient was able to drink with no nausea.

## 2023-07-05 ENCOUNTER — Ambulatory Visit: Payer: Self-pay
